# Patient Record
Sex: Female | Born: 1965 | Race: White | Hispanic: No | Marital: Married | Smoking: Former smoker
Health system: Southern US, Community
[De-identification: ages and names within clinical notes are randomized; demographics above are authoritative.]

## PROBLEM LIST (undated history)

## (undated) DIAGNOSIS — K219 Gastro-esophageal reflux disease without esophagitis: Secondary | ICD-10-CM

## (undated) HISTORY — PX: TUBAL LIGATION: SHX77

## (undated) HISTORY — PX: WISDOM TOOTH EXTRACTION: SHX21

## (undated) HISTORY — PX: TONSILLECTOMY: SUR1361

## (undated) HISTORY — PX: EYE SURGERY: SHX253

## (undated) HISTORY — PX: LEEP: SHX91

---

## 1998-02-09 ENCOUNTER — Other Ambulatory Visit: Admission: RE | Admit: 1998-02-09 | Discharge: 1998-02-09 | Payer: Self-pay | Admitting: Obstetrics and Gynecology

## 1998-07-28 ENCOUNTER — Other Ambulatory Visit: Admission: RE | Admit: 1998-07-28 | Discharge: 1998-07-28 | Payer: Self-pay | Admitting: Obstetrics and Gynecology

## 2000-10-02 ENCOUNTER — Emergency Department (HOSPITAL_COMMUNITY): Admission: EM | Admit: 2000-10-02 | Discharge: 2000-10-02 | Payer: Self-pay | Admitting: Emergency Medicine

## 2000-10-02 ENCOUNTER — Encounter: Payer: Self-pay | Admitting: Emergency Medicine

## 2001-10-29 ENCOUNTER — Other Ambulatory Visit: Admission: RE | Admit: 2001-10-29 | Discharge: 2001-10-29 | Payer: Self-pay | Admitting: Obstetrics and Gynecology

## 2002-10-12 ENCOUNTER — Ambulatory Visit (HOSPITAL_COMMUNITY): Admission: RE | Admit: 2002-10-12 | Discharge: 2002-10-12 | Payer: Self-pay | Admitting: Internal Medicine

## 2002-10-28 ENCOUNTER — Other Ambulatory Visit: Admission: RE | Admit: 2002-10-28 | Discharge: 2002-10-28 | Payer: Self-pay | Admitting: Obstetrics and Gynecology

## 2003-04-30 ENCOUNTER — Other Ambulatory Visit: Admission: RE | Admit: 2003-04-30 | Discharge: 2003-04-30 | Payer: Self-pay | Admitting: Obstetrics and Gynecology

## 2003-12-04 ENCOUNTER — Other Ambulatory Visit: Admission: RE | Admit: 2003-12-04 | Discharge: 2003-12-04 | Payer: Self-pay | Admitting: Obstetrics and Gynecology

## 2004-05-11 ENCOUNTER — Inpatient Hospital Stay (HOSPITAL_COMMUNITY): Admission: AD | Admit: 2004-05-11 | Discharge: 2004-05-15 | Payer: Self-pay | Admitting: Obstetrics and Gynecology

## 2004-06-25 ENCOUNTER — Other Ambulatory Visit: Admission: RE | Admit: 2004-06-25 | Discharge: 2004-06-25 | Payer: Self-pay | Admitting: Obstetrics and Gynecology

## 2005-07-29 ENCOUNTER — Other Ambulatory Visit: Admission: RE | Admit: 2005-07-29 | Discharge: 2005-07-29 | Payer: Self-pay | Admitting: Obstetrics and Gynecology

## 2006-09-18 ENCOUNTER — Ambulatory Visit (HOSPITAL_COMMUNITY): Admission: RE | Admit: 2006-09-18 | Discharge: 2006-09-18 | Payer: Self-pay | Admitting: Obstetrics and Gynecology

## 2007-06-17 IMAGING — MG MM DIGITAL SCREENING BILAT
5 series · 5 of 5 positions shown · non-contrast
Comparison: none

DG SCREEN MAMMOGRAM BILATERAL
Bilateral CC and MLO view(s) were taken.

DIGITAL SCREENING MAMMOGRAM WITH CAD:
The breast tissue is heterogeneously dense.  There is no dominant mass, architectural distortion or
calcification to suggest malignancy.

[R CC]
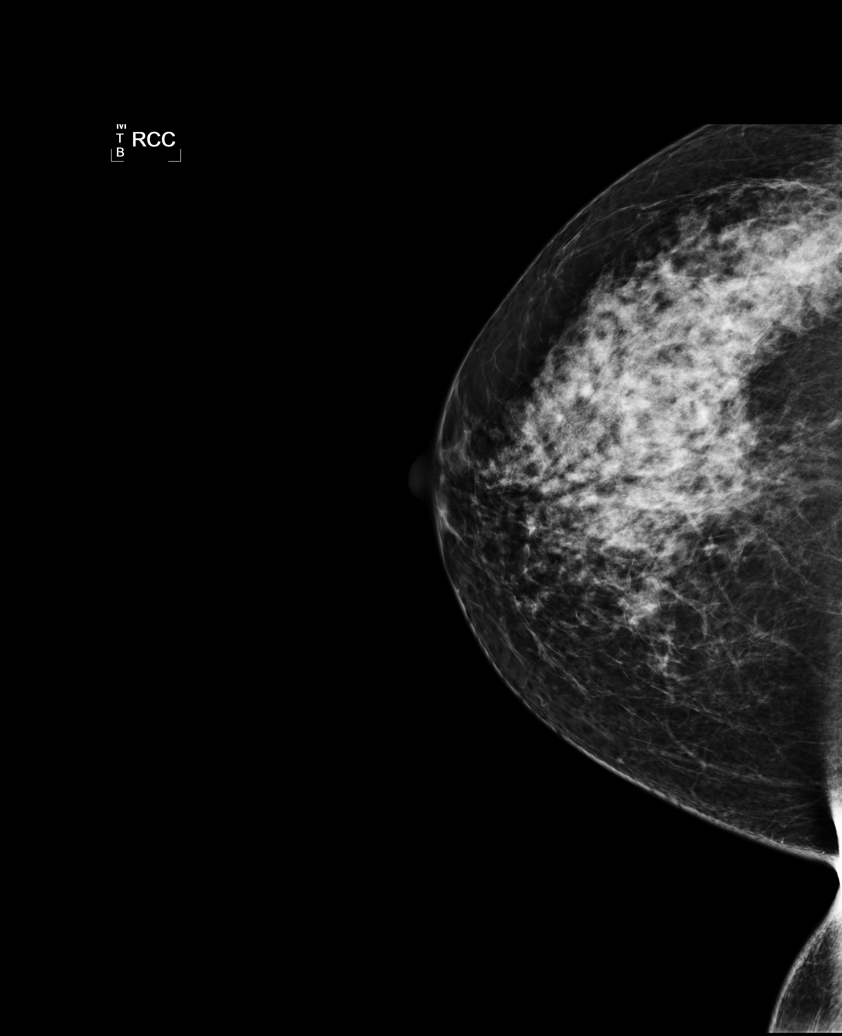

[R MLO]
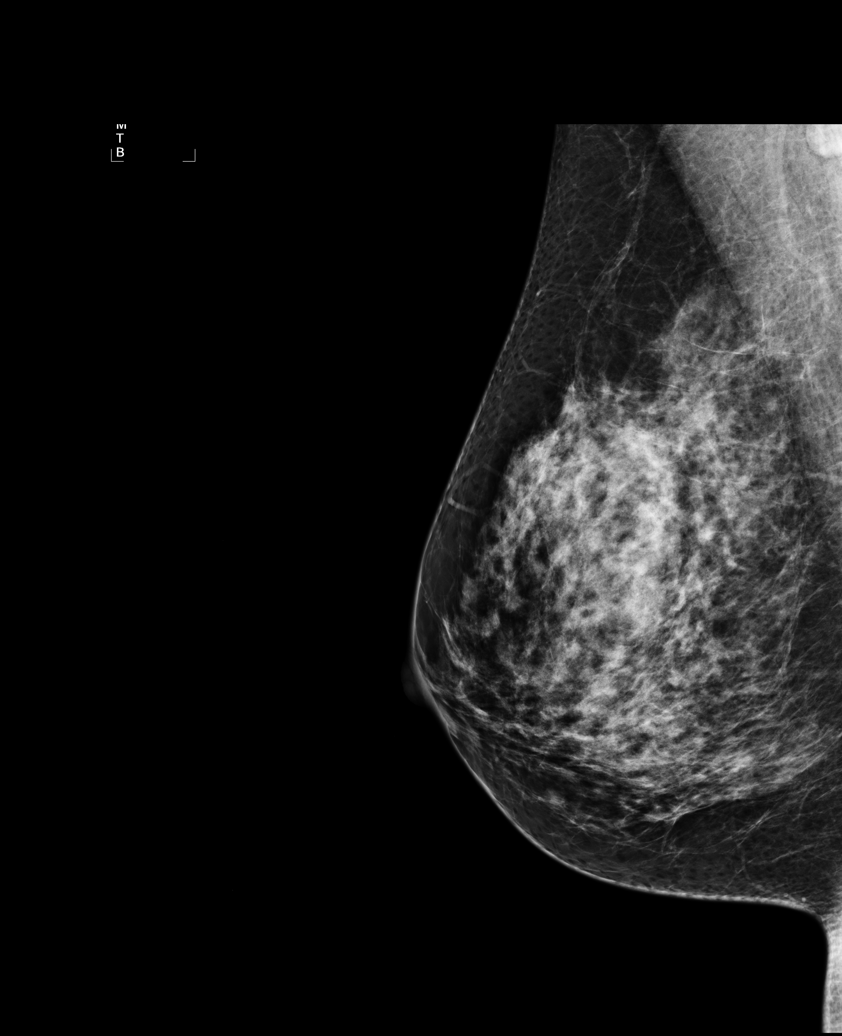

[L CC]
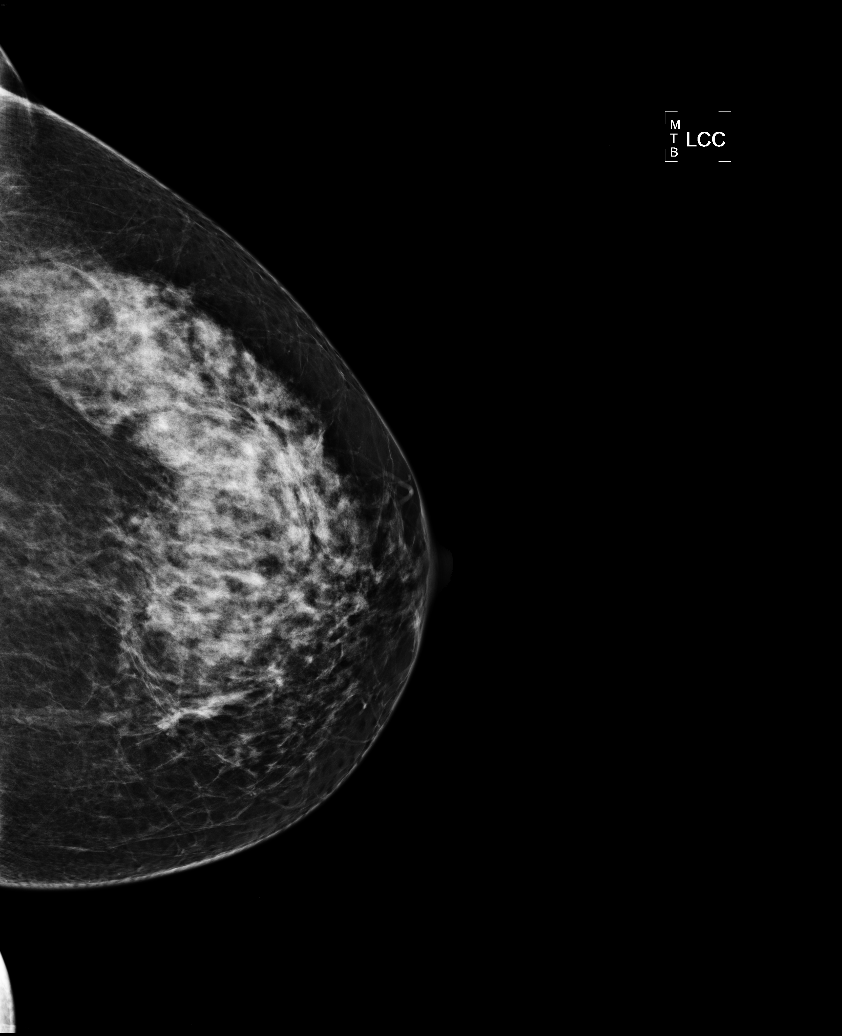

[L MLO]
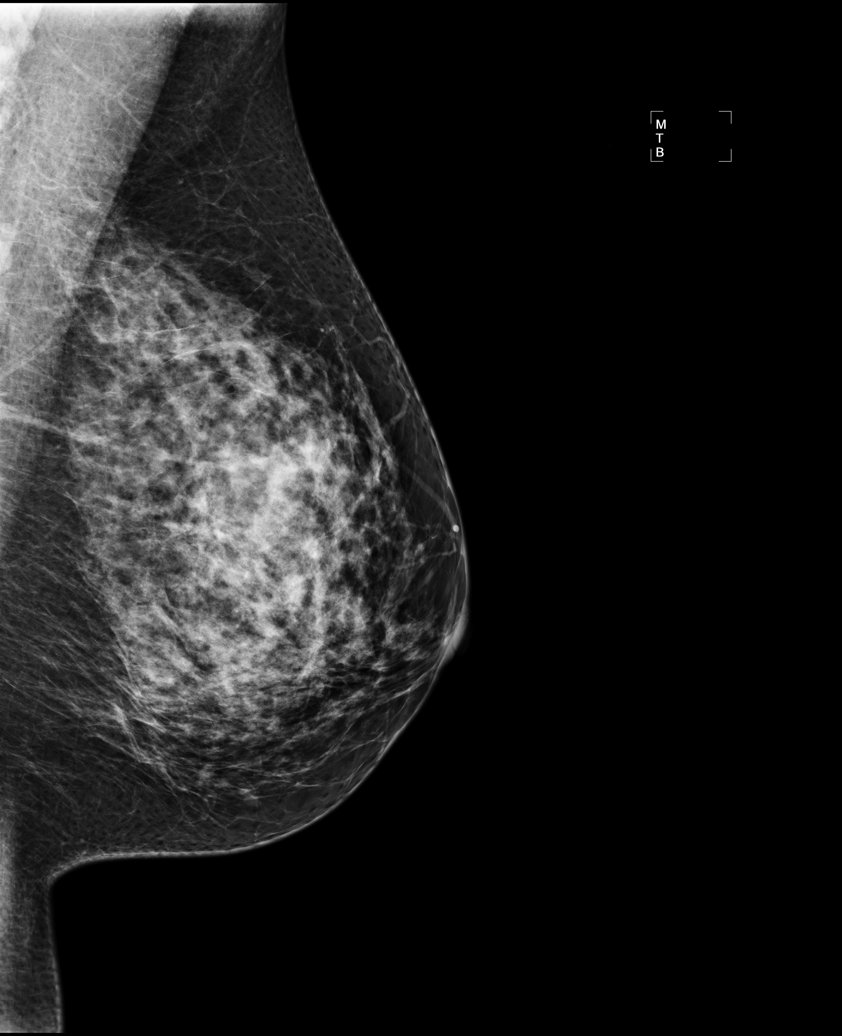

[R XCCL]
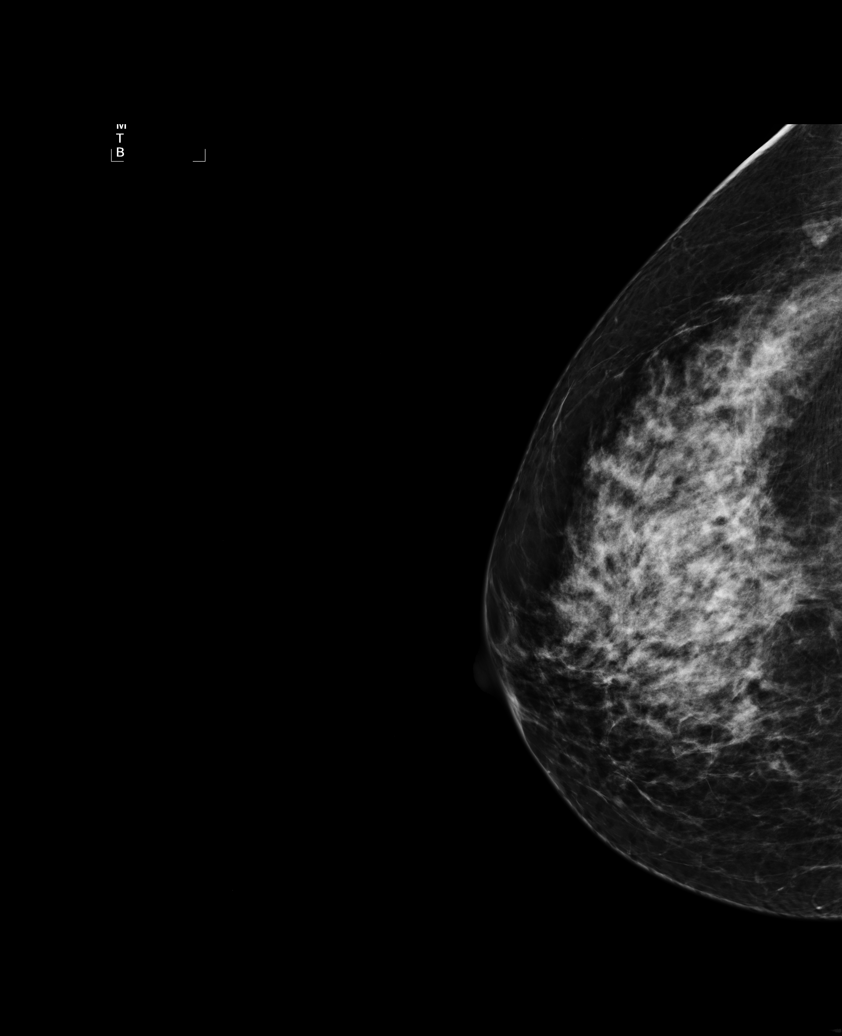

[5 of 5 positions shown; findings below may reference images not displayed]

IMPRESSION: No mammographic evidence of malignancy.  Suggest yearly screening mammography.

ASSESSMENT: Negative - BI-RADS 1

Screening mammogram in 1 year.
ANALYZED BY COMPUTER AIDED DETECTION. , THIS PROCEDURE WAS A DIGITAL MAMMOGRAM.

## 2007-10-03 ENCOUNTER — Encounter: Admission: RE | Admit: 2007-10-03 | Discharge: 2007-10-03 | Payer: Self-pay | Admitting: Obstetrics and Gynecology

## 2008-03-14 ENCOUNTER — Emergency Department (HOSPITAL_COMMUNITY): Admission: EM | Admit: 2008-03-14 | Discharge: 2008-03-14 | Payer: Self-pay | Admitting: Family Medicine

## 2008-10-29 ENCOUNTER — Encounter: Admission: RE | Admit: 2008-10-29 | Discharge: 2008-10-29 | Payer: Self-pay | Admitting: Obstetrics and Gynecology

## 2009-12-17 ENCOUNTER — Encounter: Admission: RE | Admit: 2009-12-17 | Discharge: 2009-12-17 | Payer: Self-pay | Admitting: Obstetrics and Gynecology

## 2010-12-31 ENCOUNTER — Other Ambulatory Visit: Payer: Self-pay | Admitting: Obstetrics and Gynecology

## 2010-12-31 DIAGNOSIS — Z1231 Encounter for screening mammogram for malignant neoplasm of breast: Secondary | ICD-10-CM

## 2011-01-17 ENCOUNTER — Ambulatory Visit: Payer: Self-pay

## 2011-01-26 ENCOUNTER — Ambulatory Visit
Admission: RE | Admit: 2011-01-26 | Discharge: 2011-01-26 | Disposition: A | Discharge status: Home / Self Care | Payer: BC Managed Care – PPO | Source: Ambulatory Visit | Attending: Obstetrics and Gynecology | Admitting: Obstetrics and Gynecology

## 2011-01-26 DIAGNOSIS — Z1231 Encounter for screening mammogram for malignant neoplasm of breast: Secondary | ICD-10-CM

## 2011-03-25 NOTE — Discharge Summary (Signed)
Caitlin Nielsen, Caitlin Nielsen                         ACCOUNT NO.:  [id number]   MEDICAL RECORD NO.:  [id number]                   PATIENT TYPE:  INP   LOCATION:  9112                                 FACILITY:  [facility]   PHYSICIAN:  Huel Cote, M.D.              DATE OF BIRTH:  11/15/1965   DATE OF ADMISSION:  05/11/2004  DATE OF DISCHARGE:  05/15/2004                                 DISCHARGE SUMMARY   DISCHARGE DIAGNOSES:  1. Preterm pregnancy at 36 plus weeks delivered.  2. Premature rupture of membranes.  3. Failed augmentation of labor.  4. Status post primary low transverse cesarean section.   FOLLOW UP:  The patient is to followup in two weeks for an incision check.   DISCHARGE MEDICATIONS:  1. Motrin 600 mg p.o. every six hours.  2. Percocet 1-2 tablets p.o. every four hours.   HOSPITAL COURSE:  The patient is a 45 year old, G3, P2-0-0-2 who is admitted  at 58 plus weeks with spontaneous rupture of membranes and no active  contractions.  Prenatal care had been complicated by an IVF pregnancy with  donor eggs given a prior tubal ligation.  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  Prenatal labs are as follows:  B  positive, antibody negative, RPR nonreactive, rubella immune, hepatitis B  surface antigen negative, HIV negative, GC and chlamydia negative, triple  screen normal, one hour Glucola 149 and three hour was normal.  Group B  strep was unknown.   PAST OBSTETRICAL HISTORY:  In 1994, she had an 8 pound 11 ounce infant at 41  weeks, normal vaginal delivery.  In 1997, she had a 9 pound 2 ounce infant,  vaginal delivery at 41 weeks.   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   PAST MEDICAL HISTORY:  None.   PAST SURGICAL HISTORY:  Tubal ligation in 1997, tonsillectomy in 1973.   ALLERGIES:  No known drug allergies.   On admission cervix was 50, closed and high and she was placed on Pitocin  and penicillin for her  unknown group B strep status.  The patient continued  on Pitocin throughout the day; however, made absolutely no progress despite  increasing Pitocin to 32 milliunits throughout the day.  The patient  continued in the hospital and Pitocin was discontinued given no real  progress being made and was placed on Cytotec for possible ripening given  that there was no evidence of infection.  The patient really had no  significant changes with Cytotec in three doses and therefore given that she  had now been ruptured for almost 48 hours desired to proceed with cesarean  section.  She underwent a low transverse cesarean section and was delivered  of a vigorous female infant, Apgar's are 9 & 9, weight was 5 pounds 15 ounces  and was then admitted for routine  postoperative care.  She did very well and  on  postoperative day #3 was tolerating a regular diet with pain control and was  afebrile with stable vital signs.  Her incision was clear and well  approximated and a Prolene suture was removed prior to discharge. She was  then instructed to followup in two weeks with Dr. Jackelyn Knife in the office  with medications as previously stated.                                               Huel Cote, M.D.    KR/MEDQ  D:  06/08/2004  T:  06/09/2004  Job:  [id number]

## 2011-03-25 NOTE — Op Note (Signed)
Caitlin Nielsen, Caitlin Nielsen                         ACCOUNT NO.:  [id number]   MEDICAL RECORD NO.:  [id number]                   PATIENT TYPE:  INP   LOCATION:  9112                                 FACILITY:  [facility]   PHYSICIAN:  Zenaida Niece, M.D.             DATE OF BIRTH:  20-Oct-1966   DATE OF PROCEDURE:  05/12/2004  DATE OF DISCHARGE:                                 OPERATIVE REPORT   PREOPERATIVE DIAGNOSES:  Intrauterine pregnancy at 37 weeks, prolonged  premature rupture of membranes and failure to progress.   POSTOPERATIVE DIAGNOSES:  Intrauterine pregnancy at 37 weeks, prolonged  premature rupture of membranes and failure to progress.   PROCEDURE:  Primary low transverse cesarean section.   SURGEON:  Zenaida Niece, M.D.   ANESTHESIA:  Spinal.   ESTIMATED BLOOD LOSS:  800 mL.   FINDINGS:  The patient had normal anatomy with evidence of prior tubal  ligation. She delivered a viable female infant with Apgar of 9 & 9 and weighed  5 pounds 15 ounces.   DESCRIPTION OF PROCEDURE:  The patient was taken to the operating room and  placed in the sitting position.  Dr. Pamalee Leyden instilled spinal anesthesia and  she was placed in the dorsal supine position with left lateral tilt.  The  abdomen was prepped and draped in the usual sterile fashion and a Foley  catheter inserted.  The level of her anesthesia was found to be adequate.  Her abdomen was entered via a standard Pfannenstiel incision.  A 4 cm  transverse incision was made in the lower uterine segment pushing the  bladder inferior.  This lower uterine segment was noted to be fairly thick.  Clear amniotic fluid was noted upon entering the amniotic cavity.  The  incision was extended bilaterally digitally. The fetal vertex was grasped  and delivered through the incision atraumatically.  The mouth and nares were  suctioned and the remainder of the infant delivered atraumatically. The cord  was doubly clamped and cut and the infant  handed to the awaiting pediatric  team. Cord blood and a segment of cord for gas were obtained and the  placenta delivered spontaneously. The uterus is wiped dry with a clean lap  pad and all clots and debris removed and the cervix dilated with the long  ring forceps.  The uterine incision was inspected and found to be free of  extensions.  The incision was closed in one layer being a running locking  layer with #1 chromic with adequate hemostasis.  Bleeding from serosal edges  was controlled with electrocautery.  Tubes and ovaries were inspected and  found to be normal with evidence of prior tubal ligation.  The uterine  incision was again inspected and found to be hemostatic. The subfascial  space was irrigated and made hemostatic with electrocautery.  The fascia was  closed in a running fashion starting at both ends and meeting  in the middle  with #0 Vicryl. The subcutaneous tissue  was irrigated and made hemostatic with electrocautery.  The skin was closed  with a running subcuticular suture of 4-0 Prolene followed by Steri-Strips  and a bandage.  The patient tolerated the procedure well and was taken to  the recovery room in stable condition. Counts were correct x2 and she  received 1 g of Ancef after cord clamped.                                               Zenaida Niece, M.D.    TDM/MEDQ  D:  05/12/2004  T:  05/13/2004  Job:  [id number]

## 2011-12-21 ENCOUNTER — Other Ambulatory Visit: Payer: Self-pay | Admitting: Obstetrics and Gynecology

## 2011-12-21 DIAGNOSIS — Z1231 Encounter for screening mammogram for malignant neoplasm of breast: Secondary | ICD-10-CM

## 2012-01-27 ENCOUNTER — Ambulatory Visit
Admission: RE | Admit: 2012-01-27 | Discharge: 2012-01-27 | Disposition: A | Discharge status: Home / Self Care | Payer: BC Managed Care – PPO | Source: Ambulatory Visit | Attending: Obstetrics and Gynecology | Admitting: Obstetrics and Gynecology

## 2012-01-27 DIAGNOSIS — Z1231 Encounter for screening mammogram for malignant neoplasm of breast: Secondary | ICD-10-CM

## 2013-01-14 ENCOUNTER — Other Ambulatory Visit: Payer: Self-pay

## 2013-01-14 DIAGNOSIS — Z1231 Encounter for screening mammogram for malignant neoplasm of breast: Secondary | ICD-10-CM

## 2013-02-06 ENCOUNTER — Ambulatory Visit
Admission: RE | Admit: 2013-02-06 | Discharge: 2013-02-06 | Disposition: A | Discharge status: Home / Self Care | Payer: BC Managed Care – PPO | Source: Ambulatory Visit

## 2013-02-06 DIAGNOSIS — Z1231 Encounter for screening mammogram for malignant neoplasm of breast: Secondary | ICD-10-CM

## 2013-03-15 ENCOUNTER — Encounter (HOSPITAL_COMMUNITY): Payer: Self-pay

## 2013-03-26 ENCOUNTER — Encounter (HOSPITAL_COMMUNITY): Payer: Self-pay | Admitting: *Deleted

## 2013-03-26 NOTE — H&P (Signed)
Caitlin Nielsen is an 47 y.o. female. 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  Due to this she is being admitted for cone biopsy for further evaluation and treatment.  Pertinent Gynecological History: OB History: G3, P3003 SVD at term x 2, last delivery c-section at term   Menstrual History: Patient's last menstrual period was 12/22/2012.    Past Medical History  Diagnosis Date  . SVD (spontaneous vaginal delivery)     x 2    Past Surgical History  Procedure Laterality Date  . Tubal ligation    . Cesarean section      x 1  . Tonsillectomy    . Wisdom tooth extraction    . Eye surgery      lasik eye surgery  . Leep      History reviewed. No pertinent family history.  Social History:  reports that she has quit smoking. She has never used smokeless tobacco. She reports that  drinks alcohol. She reports that she does not use illicit drugs.  Allergies: No Known Allergies  No prescriptions prior to admission    Review of Systems  Respiratory: Negative.   Cardiovascular: Negative.   Gastrointestinal: Negative.   Genitourinary: Negative.     Height 5\' 10"  (1.778 m), weight 74.844 kg (165 lb), last menstrual period 12/22/2012. Physical Exam  Constitutional: She appears well-developed and well-nourished.  Neck: Neck supple. No thyromegaly present.  Cardiovascular: Normal rate, regular rhythm and normal heart sounds.   No murmur heard. Respiratory: Effort normal and breath sounds normal. No respiratory distress. She has no wheezes.  GI: Soft. She exhibits no distension and no mass. There is no tenderness.  Genitourinary: Vagina normal and uterus normal.  No adnexal mass    No results found for this or any previous visit (from the past 24 hour(s)).  No results found.  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  Discussed findings and procedure and risks for cone biopsy.  Will admit for cold knife cone biopsy.    Caitlin Nielsen D 03/26/2013, 4:47 PM

## 2013-03-27 ENCOUNTER — Encounter (HOSPITAL_COMMUNITY): Payer: Self-pay | Admitting: Certified Registered"

## 2013-03-27 ENCOUNTER — Ambulatory Visit (HOSPITAL_COMMUNITY)
Admission: RE | Admit: 2013-03-27 | Discharge: 2013-03-27 | Disposition: A | Discharge status: Home / Self Care | Payer: BC Managed Care – PPO | Source: Ambulatory Visit | Attending: Obstetrics and Gynecology | Admitting: Obstetrics and Gynecology

## 2013-03-27 ENCOUNTER — Encounter (HOSPITAL_COMMUNITY)
Admission: RE | Disposition: A | Discharge status: Home / Self Care | Payer: Self-pay | Source: Ambulatory Visit | Attending: Obstetrics and Gynecology

## 2013-03-27 ENCOUNTER — Ambulatory Visit (HOSPITAL_COMMUNITY): Payer: BC Managed Care – PPO | Admitting: Certified Registered"

## 2013-03-27 DIAGNOSIS — D069 Carcinoma in situ of cervix, unspecified: Secondary | ICD-10-CM | POA: Insufficient documentation

## 2013-03-27 HISTORY — PX: CERVICAL CONIZATION W/BX: SHX1330

## 2013-03-27 LAB — CBC
MCV: 95 fL (ref 78.0–100.0)
Platelets: 200 10*3/uL (ref 150–400)
RDW: 12.9 % (ref 11.5–15.5)
WBC: 7 10*3/uL (ref 4.0–10.5)

## 2013-03-27 SURGERY — CONE BIOPSY, CERVIX
Anesthesia: General | Site: Vagina | Wound class: Clean Contaminated

## 2013-03-27 MED ORDER — LACTATED RINGERS IV SOLN
INTRAVENOUS | Status: DC
  Administered 2013-03-27: 08:00:00 via INTRAVENOUS

## 2013-03-27 MED ORDER — KETOROLAC TROMETHAMINE 30 MG/ML IJ SOLN
INTRAMUSCULAR | Status: AC
  Filled 2013-03-27: qty 1

## 2013-03-27 MED ORDER — LACTATED RINGERS IV SOLN
INTRAVENOUS | Status: DC
  Administered 2013-03-27: 10:00:00 via INTRAVENOUS

## 2013-03-27 MED ORDER — MIDAZOLAM HCL 5 MG/5ML IJ SOLN
INTRAMUSCULAR | Status: DC | PRN
  Administered 2013-03-27: 2 mg via INTRAVENOUS

## 2013-03-27 MED ORDER — LIDOCAINE HCL 1 % IJ SOLN
INTRAMUSCULAR | Status: DC | PRN
  Administered 2013-03-27: 16 mL

## 2013-03-27 MED ORDER — HYDROCODONE-ACETAMINOPHEN 5-325 MG PO TABS: 1.0000 | ORAL_TABLET | Freq: Four times a day (QID) | ORAL | Status: DC | PRN

## 2013-03-27 MED ORDER — PROPOFOL 10 MG/ML IV EMUL
INTRAVENOUS | Status: AC
  Filled 2013-03-27: qty 20

## 2013-03-27 MED ORDER — DEXAMETHASONE SODIUM PHOSPHATE 10 MG/ML IJ SOLN
INTRAMUSCULAR | Status: AC
  Filled 2013-03-27: qty 1

## 2013-03-27 MED ORDER — KETOROLAC TROMETHAMINE 30 MG/ML IJ SOLN
INTRAMUSCULAR | Status: DC | PRN
  Administered 2013-03-27: 30 mg via INTRAVENOUS

## 2013-03-27 MED ORDER — IODINE STRONG (LUGOLS) 5 % PO SOLN
ORAL | Status: DC | PRN
  Administered 2013-03-27: 0.2 mL

## 2013-03-27 MED ORDER — FENTANYL CITRATE 0.05 MG/ML IJ SOLN
INTRAMUSCULAR | Status: AC
  Filled 2013-03-27: qty 2

## 2013-03-27 MED ORDER — FENTANYL CITRATE 0.05 MG/ML IJ SOLN
INTRAMUSCULAR | Status: DC | PRN
  Administered 2013-03-27 (×2): 50 ug via INTRAVENOUS

## 2013-03-27 MED ORDER — FENTANYL CITRATE 0.05 MG/ML IJ SOLN: 25.0000 ug | INTRAMUSCULAR | Status: DC | PRN

## 2013-03-27 MED ORDER — ACETIC ACID 4% SOLUTION
Status: DC | PRN
  Administered 2013-03-27: 1 via TOPICAL

## 2013-03-27 MED ORDER — HYDROCODONE-ACETAMINOPHEN 5-325 MG PO TABS
1.0000 | ORAL_TABLET | Freq: Once | ORAL | Status: AC
  Administered 2013-03-27: 1 via ORAL

## 2013-03-27 MED ORDER — PROPOFOL 10 MG/ML IV BOLUS
INTRAVENOUS | Status: DC | PRN
  Administered 2013-03-27: 170 ug via INTRAVENOUS

## 2013-03-27 MED ORDER — ONDANSETRON HCL 4 MG/2ML IJ SOLN
INTRAMUSCULAR | Status: AC
  Filled 2013-03-27: qty 2

## 2013-03-27 MED ORDER — MIDAZOLAM HCL 2 MG/2ML IJ SOLN
INTRAMUSCULAR | Status: AC
  Filled 2013-03-27: qty 2

## 2013-03-27 MED ORDER — LIDOCAINE HCL (CARDIAC) 20 MG/ML IV SOLN
INTRAVENOUS | Status: AC
  Filled 2013-03-27: qty 5

## 2013-03-27 MED ORDER — HYDROCODONE-ACETAMINOPHEN 5-325 MG PO TABS
ORAL_TABLET | ORAL | Status: AC
  Filled 2013-03-27: qty 1

## 2013-03-27 MED ORDER — LIDOCAINE HCL (CARDIAC) 20 MG/ML IV SOLN
INTRAVENOUS | Status: DC | PRN
  Administered 2013-03-27: 30 mg via INTRAVENOUS
  Administered 2013-03-27: 20 mg via INTRAVENOUS

## 2013-03-27 MED ORDER — ONDANSETRON HCL 4 MG/2ML IJ SOLN
INTRAMUSCULAR | Status: DC | PRN
  Administered 2013-03-27: 4 mg via INTRAVENOUS

## 2013-03-27 SURGICAL SUPPLY — 38 items
APPLICATOR COTTON TIP 6IN STRL (MISCELLANEOUS) ×2 IMPLANT
BLADE SURG 15 STRL LF C SS BP (BLADE) ×1 IMPLANT
BLADE SURG 15 STRL SS (BLADE) ×2
CATH ROBINSON RED A/P 16FR (CATHETERS) ×2 IMPLANT
CLOTH BEACON ORANGE TIMEOUT ST (SAFETY) ×2 IMPLANT
CONTAINER PREFILL 10% NBF 60ML (FORM) ×2 IMPLANT
COUNTER NEEDLE 1200 MAGNETIC (NEEDLE) ×2 IMPLANT
DECANTER SPIKE VIAL GLASS SM (MISCELLANEOUS) ×2 IMPLANT
DRESSING TELFA 8X3 (GAUZE/BANDAGES/DRESSINGS) ×2 IMPLANT
ELECT BALL LEEP 5MM RED (ELECTRODE) ×2 IMPLANT
ELECT REM PT RETURN 9FT ADLT (ELECTROSURGICAL) ×2
ELECTRODE REM PT RTRN 9FT ADLT (ELECTROSURGICAL) ×1 IMPLANT
GAUZE SPONGE 4X4 16PLY XRAY LF (GAUZE/BANDAGES/DRESSINGS) IMPLANT
GLOVE BIO SURGEON STRL SZ8 (GLOVE) ×2 IMPLANT
GLOVE ORTHO TXT STRL SZ7.5 (GLOVE) ×2 IMPLANT
GOWN STRL REIN XL XLG (GOWN DISPOSABLE) ×4 IMPLANT
HEMOSTAT SURGICEL 2X14 (HEMOSTASIS) IMPLANT
HEMOSTAT SURGICEL 2X3 (HEMOSTASIS) ×1 IMPLANT
HOSE NS SMOKE EVAC 7/8 X6 (MISCELLANEOUS) ×1 IMPLANT
NDL SPNL 22GX3.5 QUINCKE BK (NEEDLE) ×1 IMPLANT
NEEDLE SPNL 22GX3.5 QUINCKE BK (NEEDLE) ×2 IMPLANT
NS IRRIG 1000ML POUR BTL (IV SOLUTION) ×1 IMPLANT
PACK VAGINAL MINOR WOMEN LF (CUSTOM PROCEDURE TRAY) ×2 IMPLANT
PAD OB MATERNITY 4.3X12.25 (PERSONAL CARE ITEMS) ×2 IMPLANT
PENCIL BUTTON HOLSTER BLD 10FT (ELECTRODE) ×2 IMPLANT
REDUCER FITTING SMOKE EVAC (MISCELLANEOUS) ×1 IMPLANT
SCOPETTES 8  STERILE (MISCELLANEOUS) ×1
SCOPETTES 8 STERILE (MISCELLANEOUS) ×2 IMPLANT
SPONGE SURGIFOAM ABS GEL 12-7 (HEMOSTASIS) IMPLANT
SUT CHROMIC 1 CT1 27 (SUTURE) ×4 IMPLANT
SYR 20CC LL (SYRINGE) ×2 IMPLANT
SYR CONTROL 10ML LL (SYRINGE) ×2 IMPLANT
SYR TB 1ML 27GX1/2 SAFE (SYRINGE) ×1 IMPLANT
SYR TB 1ML 27GX1/2 SAFETY (SYRINGE) ×2
TOWEL OR 17X24 6PK STRL BLUE (TOWEL DISPOSABLE) ×4 IMPLANT
TUBING NON-CON 1/4 X 20 CONN (TUBING) ×2 IMPLANT
WATER STERILE IRR 1000ML POUR (IV SOLUTION) ×2 IMPLANT
YANKAUER SUCT BULB TIP NO VENT (SUCTIONS) ×2 IMPLANT

## 2013-03-27 NOTE — Transfer of Care (Signed)
Immediate Anesthesia Transfer of Care Note  Patient: Caitlin Nielsen  Procedure(s) Performed: Procedure(s): CONIZATION CERVIX WITH BIOPSY, cold knife (N/A)  Patient Location: PACU  Anesthesia Type:General  Level of Consciousness: awake  Airway & Oxygen Therapy: Patient Spontanous Breathing and Patient connected to nasal cannula oxygen  Post-op Assessment: Report given to PACU RN and Post -op Vital signs reviewed and stable  Post vital signs: Reviewed and stable  Complications: No apparent anesthesia complications

## 2013-03-27 NOTE — OR Nursing (Signed)
Light headed. Resumed IV fluids. Reclined in chair.

## 2013-03-27 NOTE — Interval H&P Note (Signed)
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 After consideration of risks, benefits and other options for treatment, the patient has consented to  Procedure(s): CONIZATION CERVIX WITH BIOPSY, cold knife (N/A) as a surgical intervention .  The patient's history has been reviewed, patient examined, no change in status, stable for surgery.  I have reviewed the patient's chart and labs.  Questions were answered to the patient's satisfaction.     [name] D

## 2013-03-27 NOTE — Anesthesia Preprocedure Evaluation (Addendum)
Anesthesia Evaluation  Patient identified by MRN, date of birth, ID band Patient awake    Reviewed: Allergy & Precautions, H&P , Patient's Chart, lab work & pertinent test results, reviewed documented beta blocker date and time   Airway Mallampati: II TM Distance: >3 FB Neck ROM: full    Dental no notable dental hx.    Pulmonary  breath sounds clear to auscultation  Pulmonary exam normal       Cardiovascular Rhythm:regular Rate:Normal     Neuro/Psych    GI/Hepatic   Endo/Other    Renal/GU      Musculoskeletal   Abdominal   Peds  Hematology   Anesthesia Other Findings   Reproductive/Obstetrics                           Anesthesia Physical Anesthesia Plan  ASA: II  Anesthesia Plan: General   Post-op Pain Management:    Induction: Intravenous  Airway Management Planned: LMA and Mask  Additional Equipment:   Intra-op Plan:   Post-operative Plan:   Informed Consent: I have reviewed the patients History and Physical, chart, labs and discussed the procedure including the risks, benefits and alternatives for the proposed anesthesia with the patient or authorized representative who has indicated his/her understanding and acceptance.   Dental Advisory Given  Plan Discussed with: CRNA and Surgeon  Anesthesia Plan Comments: (  Discussed  general anesthesia, including possible nausea, instrumentation of airway, sore throat,pulmonary aspiration, etc. I asked if the were any outstanding questions, or  concerns before we proceeded. )       Anesthesia Quick Evaluation

## 2013-03-27 NOTE — Op Note (Signed)
Preoperative diagnosis: CIN-3 Postoperative diagnosis: Same Procedure: Cold knife cone biopsy Surgeon: Lavina Hamman M.D. Anesthesia: Monitored anesthesia care Estimated blood loss: 50 cc Findings: She had a well-healed LEEP bed, the entire ectocervix stained with Lugol's solution Complications: None Specimens: Cone biopsy   Procedure in detail:  The patient was taken to the operating room placed in the dorsosupine position. She was given IV sedation and placed in mobile stirrups. Perineum and vagina were then prepped and draped in the usual sterile fashion and the legs were elevated in stirrups. Bladder was drained with a red Robinson catheter. A weighted speculum was inserted into the vagina and Deaver retractors used anteriorly and laterally. Stay sutures of #0 chromic were placed at 3 and 9:00. Deep paracervical block was then performed with a total of 16 cc of 1% plain lidocaine. The cervix was then stained with Lugol's solution and there were no nonstaining areas. The previous lead bed was well demarcated. I then used a scalpel to remove a cone biopsy specimen out well lateral of any ectocervical margins and took a deep endocervical margin. The specimen was removed intact. Bleeding was controlled with electrocautery. The cone bed was then packed with Surgicel and the stay sutures were tied across the intercourse the cervix. No significant bleeding was noted. All instruments were removed from the vagina. The patient was taken down from stirrups and taken to recovery in stable condition. Counts were correct and she had PAS hose on throughout the procedure.

## 2013-03-27 NOTE — Anesthesia Postprocedure Evaluation (Signed)
  Anesthesia Post-op Note  Patient: Caitlin Nielsen  Procedure(s) Performed: Procedure(s): CONIZATION CERVIX WITH BIOPSY, cold knife (N/A)  Patient is awake and responsive. Pain and nausea are reasonably well controlled. Vital signs are stable and clinically acceptable. Oxygen saturation is clinically acceptable. There are no apparent anesthetic complications at this time. Patient is ready for discharge.

## 2013-03-28 ENCOUNTER — Encounter (HOSPITAL_COMMUNITY): Payer: Self-pay | Admitting: Obstetrics and Gynecology

## 2019-08-27 ENCOUNTER — Other Ambulatory Visit: Payer: Self-pay

## 2019-08-27 ENCOUNTER — Emergency Department: Payer: BC Managed Care – PPO

## 2019-08-27 ENCOUNTER — Encounter: Payer: Self-pay | Admitting: Emergency Medicine

## 2019-08-27 ENCOUNTER — Emergency Department
Admission: EM | Admit: 2019-08-27 | Discharge: 2019-08-27 | Disposition: A | Discharge status: Home / Self Care | Payer: BC Managed Care – PPO | Attending: Emergency Medicine | Admitting: Emergency Medicine

## 2019-08-27 DIAGNOSIS — R0602 Shortness of breath: Secondary | ICD-10-CM | POA: Diagnosis present

## 2019-08-27 DIAGNOSIS — R0600 Dyspnea, unspecified: Secondary | ICD-10-CM | POA: Insufficient documentation

## 2019-08-27 DIAGNOSIS — Z20828 Contact with and (suspected) exposure to other viral communicable diseases: Secondary | ICD-10-CM | POA: Insufficient documentation

## 2019-08-27 DIAGNOSIS — Z87891 Personal history of nicotine dependence: Secondary | ICD-10-CM | POA: Diagnosis not present

## 2019-08-27 DIAGNOSIS — R05 Cough: Secondary | ICD-10-CM | POA: Diagnosis not present

## 2019-08-27 DIAGNOSIS — R1013 Epigastric pain: Secondary | ICD-10-CM | POA: Diagnosis not present

## 2019-08-27 DIAGNOSIS — R059 Cough, unspecified: Secondary | ICD-10-CM

## 2019-08-27 HISTORY — DX: Gastro-esophageal reflux disease without esophagitis: K21.9

## 2019-08-27 LAB — CBC WITH DIFFERENTIAL/PLATELET
Abs Immature Granulocytes: 0.02 10*3/uL (ref 0.00–0.07)
Basophils Absolute: 0.1 10*3/uL (ref 0.0–0.1)
Basophils Relative: 1 %
Eosinophils Absolute: 0.1 10*3/uL (ref 0.0–0.5)
Eosinophils Relative: 2 %
HCT: 41.8 % (ref 36.0–46.0)
Hemoglobin: 13.8 g/dL (ref 12.0–15.0)
Immature Granulocytes: 0 %
Lymphocytes Relative: 38 %
Lymphs Abs: 2.8 10*3/uL (ref 0.7–4.0)
MCH: 31.4 pg (ref 26.0–34.0)
MCHC: 33 g/dL (ref 30.0–36.0)
MCV: 95 fL (ref 80.0–100.0)
Monocytes Absolute: 0.5 10*3/uL (ref 0.1–1.0)
Monocytes Relative: 6 %
Neutro Abs: 4 10*3/uL (ref 1.7–7.7)
Neutrophils Relative %: 53 %
Platelets: 289 10*3/uL (ref 150–400)
RBC: 4.4 MIL/uL (ref 3.87–5.11)
RDW: 12.1 % (ref 11.5–15.5)
WBC: 7.5 10*3/uL (ref 4.0–10.5)
nRBC: 0 % (ref 0.0–0.2)

## 2019-08-27 LAB — COMPREHENSIVE METABOLIC PANEL
ALT: 22 U/L (ref 0–44)
AST: 22 U/L (ref 15–41)
Albumin: 4.7 g/dL (ref 3.5–5.0)
Alkaline Phosphatase: 99 U/L (ref 38–126)
Anion gap: 13 (ref 5–15)
BUN: 18 mg/dL (ref 6–20)
CO2: 26 mmol/L (ref 22–32)
Calcium: 9.3 mg/dL (ref 8.9–10.3)
Chloride: 104 mmol/L (ref 98–111)
Creatinine, Ser: 0.69 mg/dL (ref 0.44–1.00)
GFR calc Af Amer: >60 mL/min (ref >60)
GFR calc non Af Amer: >60 mL/min (ref >60)
Glucose, Bld: 123 mg/dL — ABNORMAL HIGH (ref 70–99)
Potassium: 3.7 mmol/L (ref 3.5–5.1)
Sodium: 143 mmol/L (ref 135–145)
Total Bilirubin: 0.6 mg/dL (ref 0.3–1.2)
Total Protein: 7.8 g/dL (ref 6.5–8.1)

## 2019-08-27 LAB — TROPONIN I (HIGH SENSITIVITY): Troponin I (High Sensitivity): 6 ng/L (ref <18)

## 2019-08-27 LAB — SARS CORONAVIRUS 2 (TAT 6-24 HRS): SARS Coronavirus 2: NEGATIVE

## 2019-08-27 MED ORDER — OMEPRAZOLE MAGNESIUM 20 MG PO TBEC: 20.0000 mg | DELAYED_RELEASE_TABLET | Freq: Every day | ORAL | 1 refills | Status: DC

## 2019-08-27 MED ORDER — HYDROCODONE-HOMATROPINE 5-1.5 MG/5ML PO SYRP: 5.0000 mL | ORAL_SOLUTION | Freq: Four times a day (QID) | ORAL | 0 refills | Status: DC | PRN

## 2019-08-27 NOTE — Discharge Instructions (Signed)
As we discussed, your work-up was reassuring tonight.  Most likely your coughing and shortness of breath are due to acid reflux.  However, as per our discussion, we sent a COVID-19 swab.  Please look through this information to learn how to set up a MyChart account if you do not already have one so that you can check your COVID-19 results within the next 24 hours or so.  In the meantime please continue to try to isolate from other people.    Assuming, however, that this is the result of the acid reflux, I recommend that you try taking an over-the-counter medication such as Prilosec (omeprazole).  Try it for a couple of weeks to see if it helps with your symptoms.  Follow-up with your regular doctor to discuss this and your other symptoms.  Return to the emergency department if you develop new or worsening symptoms that concern you.

## 2019-08-27 NOTE — ED Provider Notes (Signed)
[facility] Emergency Department Provider Note  ____________________________________________   First MD Initiated Contact with Patient 08/27/19 [phone]     (approximate)  I have reviewed the triage vital signs and the nursing notes.   HISTORY  Chief Complaint Shortness of Breath    HPI Caitlin Nielsen is a 53 y.o. female who reports no contributory past medical history other than acid reflux and presents for evaluation of acute onset shortness of breath and nonproductive cough.  She said that she had a normal day yesterday and she woke up during the night  with a cough and some difficulty breathing.  She suffers from severe reflux for which she only takes Tums so she thought it might be an issue of the reflux but she does not have a bad taste in her mouth which is frequently the case with reflux.  She has not been in contact with anyone with COVID-19.  She denies fever/chills, chest pain, nausea, vomiting, and dysuria.  She describes the symptoms as moderate and nothing in particular makes it better or worse.  She is having a little bit of burning epigastric discomfort similar to prior episodes of reflux.  She has no history of blood clots in the legs of the lungs, no unilateral leg pain or swelling, no recent immobilizations or trips or surgeries, no birth control or exogenous estrogen, and she does not smoke.  No history of heart disease.        Past Medical History:  Diagnosis Date  . Acid reflux   . SVD (spontaneous vaginal delivery)    x 2    Patient Active Problem List   Diagnosis Date Noted  . CIN III (cervical intraepithelial neoplasia grade III) with severe dysplasia 03/27/2013    Past Surgical History:  Procedure Laterality Date  . CERVICAL CONIZATION W/BX N/A 03/27/2013   Procedure: CONIZATION CERVIX WITH BIOPSY, cold knife;  Surgeon: Cheri Fowler, MD;  Location: [facility];  Service: Gynecology;  Laterality: N/A;  . CESAREAN SECTION     x 1   . EYE SURGERY     lasik eye surgery  . LEEP    . TONSILLECTOMY    . TUBAL LIGATION    . WISDOM TOOTH EXTRACTION      Prior to Admission medications   Medication Sig Start Date End Date Taking? Authorizing Provider  aspirin 81 MG tablet Take 81 mg by mouth daily as needed.    [provider]  HYDROcodone-acetaminophen (NORCO) 5-325 MG per tablet Take 1 tablet by mouth every 6 (six) hours as needed for pain. 03/27/13   Meisinger, Sherren Mocha, MD  HYDROcodone-homatropine [facility]) 5-1.5 MG/5ML syrup Take 5 mLs by mouth every 6 (six) hours as needed for cough. 08/27/19   Hinda Kehr, MD  omeprazole (PRILOSEC OTC) 20 MG tablet Take 1 tablet (20 mg total) by mouth daily. 08/27/19 08/26/20  Hinda Kehr, MD    Allergies Patient has no known allergies.  History reviewed. No pertinent family history.  Social History Social History   Tobacco Use  . Smoking status: Former Smoker    Packs/day: 0.25  . Smokeless tobacco: Never Used  Substance Use Topics  . Alcohol use: Yes    Comment: socially  . Drug use: No    Review of Systems Constitutional: No fever/chills Eyes: No visual changes. ENT: No sore throat. Cardiovascular: Denies chest pain. Respiratory: Acute onset shortness of breath and cough. Gastrointestinal: Some burning epigastric abdominal pain.  No nausea, no vomiting.  No  diarrhea.  No constipation. Genitourinary: Negative for dysuria. Musculoskeletal: Negative for neck pain.  Negative for back pain. Integumentary: Negative for rash. Neurological: Negative for headaches, focal weakness or numbness.   ____________________________________________   PHYSICAL EXAM:  VITAL SIGNS: ED Triage Vitals  Enc Vitals Group     BP 08/27/19 0150 (!) 160/94     Pulse Rate 08/27/19 0150 79     Resp 08/27/19 0150 18     Temp 08/27/19 0150 98.1 F (36.7 C)     Temp Source 08/27/19 0150 Oral     SpO2 08/27/19 0150 97 %     Weight 08/27/19 0148 81.6 kg (180 lb)     Height  08/27/19 0148 1.778 m (5\' 10" )     Head Circumference --      Peak Flow --      Pain Score 08/27/19 0147 0     Pain Loc --      Pain Edu? --      Excl. in GC? --     Constitutional: Alert and oriented.  Appears a little bit uncomfortable but generally well-appearing. Eyes: Conjunctivae are normal.  Head: Atraumatic. Nose: No congestion/rhinnorhea. Mouth/Throat: Mucous membranes are moist. Neck: No stridor.  No meningeal signs.   Cardiovascular: Normal rate, regular rhythm. Good peripheral circulation. Grossly normal heart sounds. Respiratory: Normal respiratory effort.  No retractions.  Frequent cough. Gastrointestinal: Soft and nontender. No distention.  Musculoskeletal: No lower extremity tenderness nor edema. No gross deformities of extremities. Neurologic:  Normal speech and language. No gross focal neurologic deficits are appreciated.  Skin:  Skin is warm, dry and intact.    ____________________________________________   LABS (all labs ordered are listed, but only abnormal results are displayed)  Labs Reviewed  COMPREHENSIVE METABOLIC PANEL - Abnormal; Notable for the following components:      Result Value   Glucose, Bld 123 (*)    All other components within normal limits  SARS CORONAVIRUS 2 (TAT 6-24 HRS)  CBC WITH DIFFERENTIAL/PLATELET  TROPONIN I (HIGH SENSITIVITY)   ____________________________________________  EKG  ED ECG REPORT I, 08/29/19, the attending physician, personally viewed and interpreted this ECG.  Date: 08/27/2019 EKG Time: 1:42 AM Rate: 85 Rhythm: normal sinus rhythm QRS Axis: normal Intervals: normal ST/T Wave abnormalities: normal Narrative Interpretation: no evidence of acute ischemia  ____________________________________________  RADIOLOGY I, 08/29/2019, personally viewed and evaluated these images (plain radiographs) as part of my medical decision making, as well as reviewing the written report by the radiologist.  ED MD  interpretation: No acute abnormalities on chest x-ray  Official radiology report(s): Dg Chest 2 View  Result Date: 08/27/2019 CLINICAL DATA:  Chest pain EXAM: CHEST - 2 VIEW COMPARISON:  None. FINDINGS: The heart size and mediastinal contours are within normal limits. Both lungs are clear. The visualized skeletal structures are unremarkable. IMPRESSION: No active cardiopulmonary disease. Electronically Signed   By: 08/29/2019 M.D.   On: 08/27/2019 02:19    ____________________________________________   PROCEDURES   Procedure(s) performed (including Critical Care):  Procedures   ____________________________________________   INITIAL IMPRESSION / MDM / ASSESSMENT AND PLAN / ED COURSE  As part of my medical decision making, I reviewed the following data within the electronic MEDICAL RECORD NUMBER Nursing notes reviewed and incorporated, Labs reviewed , EKG interpreted , Old chart reviewed, Radiograph reviewed  and Notes from prior ED visits and reviewed 08/29/2019 controlled substance database.     Differential diagnosis includes, but is not limited to, acid  reflux, chemical pneumonitis, community-acquired pneumonia, viral bronchitis, COVID-19.  The patient is well-appearing and in no distress except for frequent cough and clearing her throat.  Normal CBC, normal troponin, normal comprehensive metabolic panel.  Very low risk for PE with Wells score of zero, low risk ACS based on HEAR score.  Vital signs are normal as well other than some mild hypertension.  I suspect this is the result of acid reflux but the patient agreed to a COVID-19 swab which we will obtain prior to her discharge.  She knows to check MyChart for the results.  I gave my usual and customary return precautions.          ____________________________________________  FINAL CLINICAL IMPRESSION(S) / ED DIAGNOSES  Final diagnoses:  Dyspnea, unspecified type  Cough     MEDICATIONS GIVEN DURING THIS VISIT:   Medications - No data to display   ED Discharge Orders         Ordered    HYDROcodone-homatropine (HYCODAN) 5-1.5 MG/5ML syrup  Every 6 hours PRN     08/27/19 0532    omeprazole (PRILOSEC OTC) 20 MG tablet  Daily     08/27/19 0532          *Please note:  Jamas LavKimberly P Ellzey was evaluated in Emergency Department on 08/27/2019 for the symptoms described in the history of present illness. She was evaluated in the context of the global COVID-19 pandemic, which necessitated consideration that the patient might be at risk for infection with the SARS-CoV-2 virus that causes COVID-19. Institutional protocols and algorithms that pertain to the evaluation of patients at risk for COVID-19 are in a state of rapid change based on information released by regulatory bodies including the CDC and federal and state organizations. These policies and algorithms were followed during the patient's care in the ED.  Some ED evaluations and interventions may be delayed as a result of limited staffing during the pandemic.*  Note:  This document was prepared using Dragon voice recognition software and may include unintentional dictation errors.   Loleta RoseForbach, Avangelina Flight, MD 08/27/19 [phone]

## 2019-08-27 NOTE — ED Triage Notes (Addendum)
Pt to triage via w/c with no distress noted, mask in place, brought in by EMS; pt reports awoke with [facility] & nonprod cough; st hx reflux; denies any pain, denies any recent illness

## 2020-05-25 IMAGING — CR DG CHEST 2V
2 series · 2 of 2 positions shown · non-contrast
Comparison: None.

CLINICAL DATA: Chest pain

EXAM:
CHEST - 2 VIEW

[chest pa]
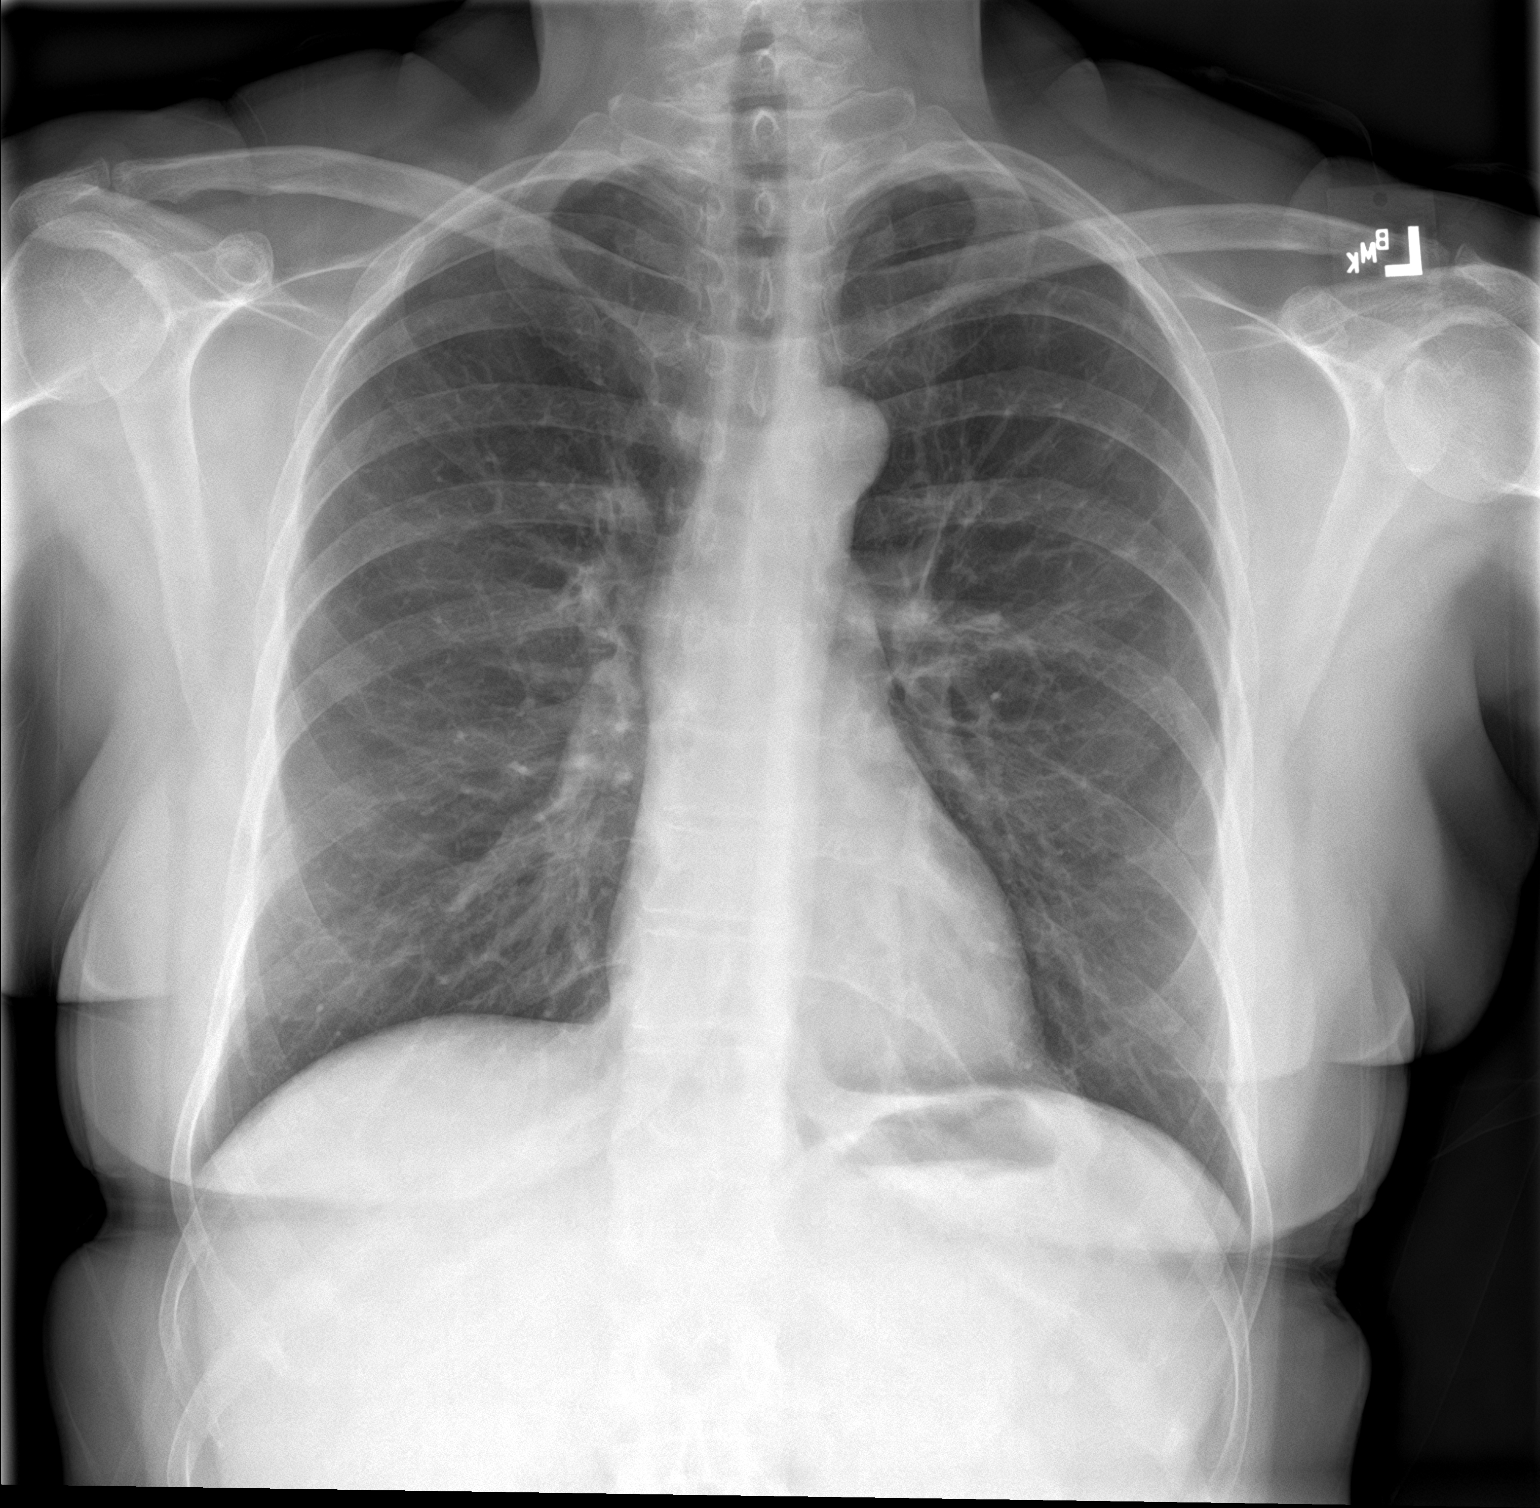

[chest lat]
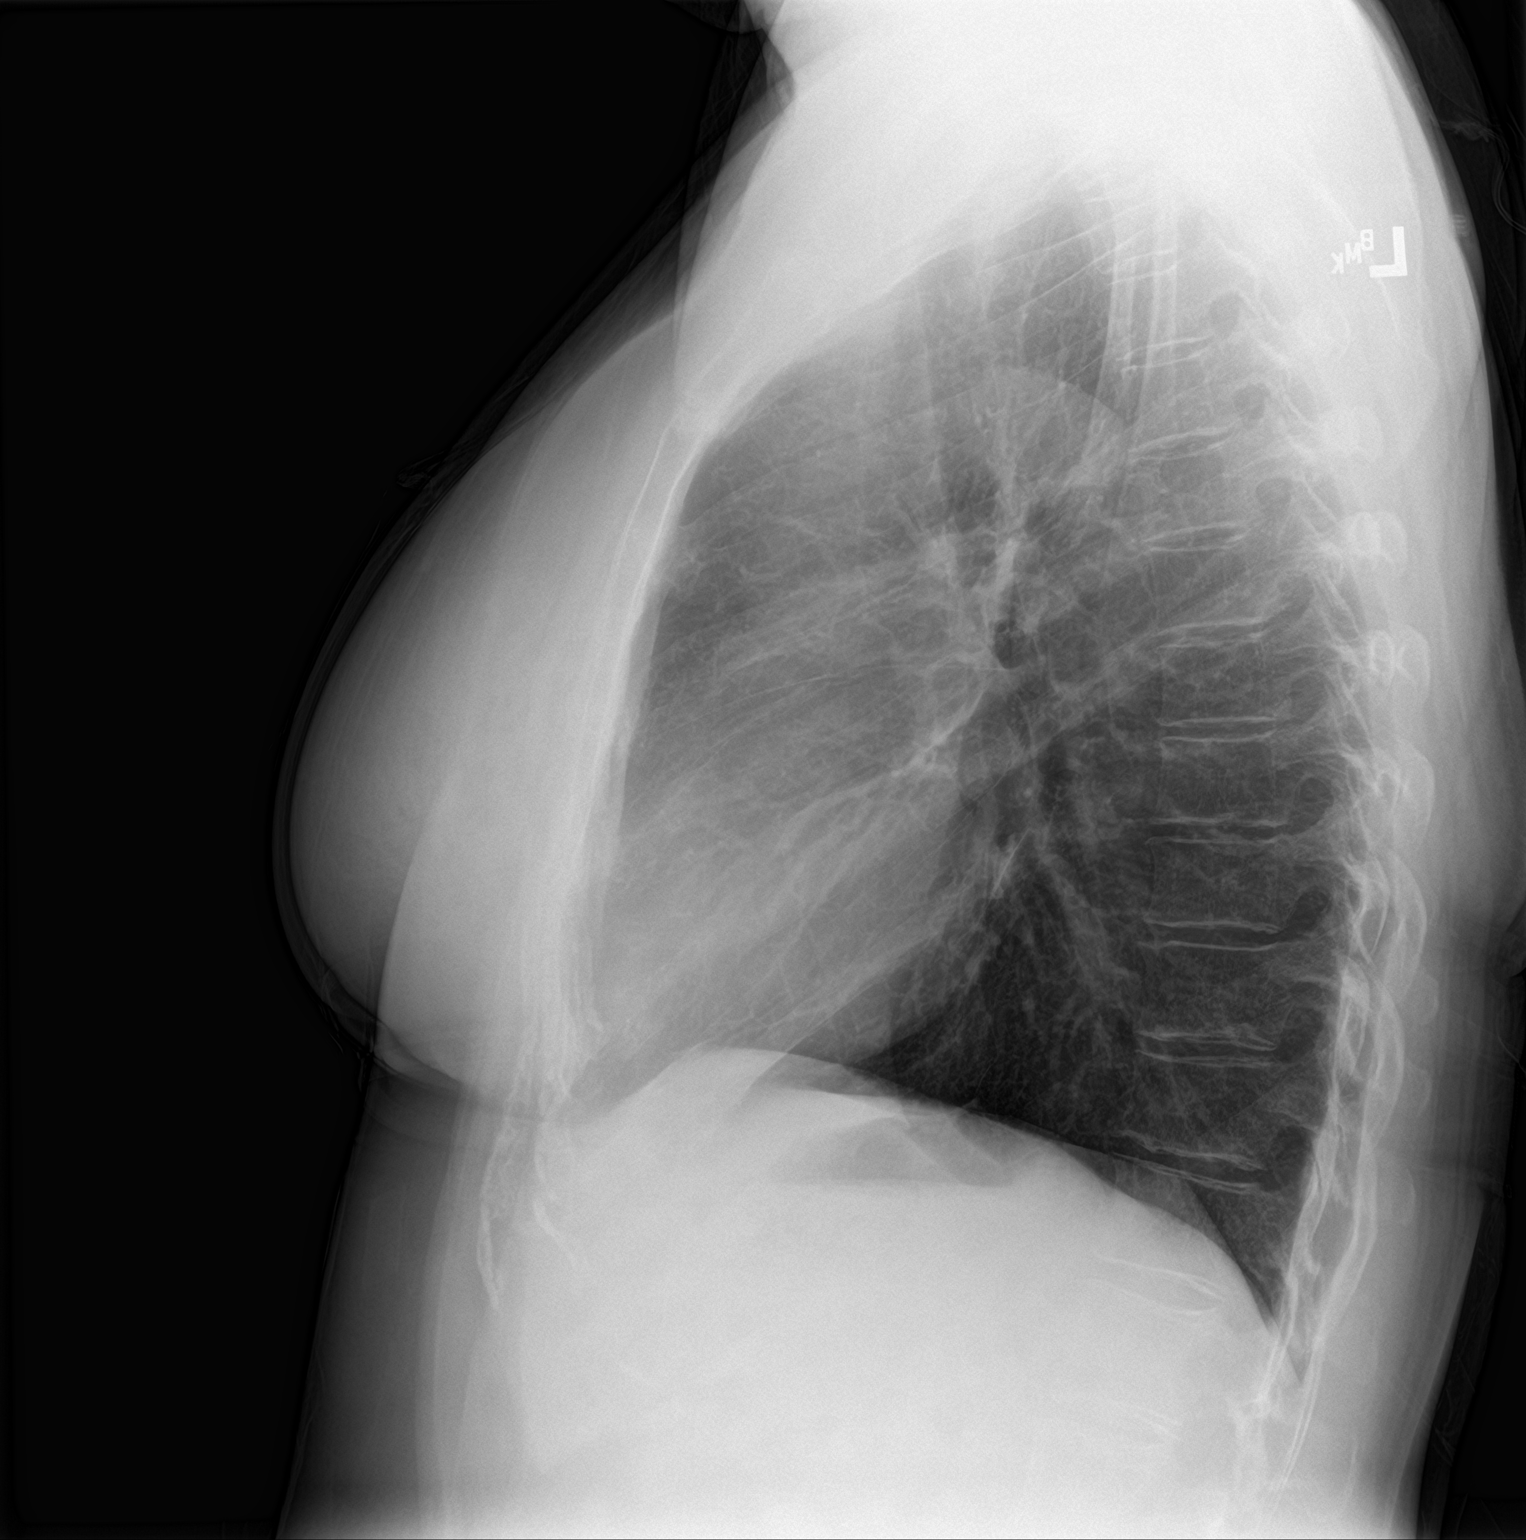

[2 of 2 positions shown; findings below may reference images not displayed]

FINDINGS: The heart size and mediastinal contours are within normal limits.
Both lungs are clear. The visualized skeletal structures are
unremarkable.
IMPRESSION: No active cardiopulmonary disease.

## 2021-06-09 ENCOUNTER — Other Ambulatory Visit: Payer: Self-pay

## 2021-06-09 ENCOUNTER — Ambulatory Visit (INDEPENDENT_AMBULATORY_CARE_PROVIDER_SITE_OTHER): Payer: Self-pay | Admitting: Plastic Surgery

## 2021-06-09 DIAGNOSIS — Z411 Encounter for cosmetic surgery: Secondary | ICD-10-CM

## 2021-06-09 NOTE — Progress Notes (Signed)
Patient presents to discuss Botox treatment.  She has had it in the past but cannot remember exactly how much.  She is headed in the forehead, glabella and crows feet.  On exam she has dynamic and static lines in those areas.  We discussed the risks and benefits of Botox treatment and she is interested in moving forward.  All the areas were prepped with an alcohol pad and 32 units of Botox were distributed throughout the forehead, glabella and crows feet.  All of her questions were answered we will plan to see her again in 2 weeks for touchup if necessary and otherwise at her next visit.

## 2021-06-23 ENCOUNTER — Other Ambulatory Visit: Payer: Self-pay

## 2021-06-23 ENCOUNTER — Ambulatory Visit (INDEPENDENT_AMBULATORY_CARE_PROVIDER_SITE_OTHER): Payer: Self-pay | Admitting: Plastic Surgery

## 2021-06-23 DIAGNOSIS — Z411 Encounter for cosmetic surgery: Secondary | ICD-10-CM

## 2021-06-23 NOTE — Progress Notes (Signed)
Patient presents about 2 weeks postop from Botox injection to the forehead, glabella and crows feet.  She is overall happy but feels that she would want a little bit more particularly in the upper forehead.  On animation there is still some movement just inferior to her hairline and a little bit of lateral pole to her eyebrows.  Forehead was prepped with alcohol pad and 10 units of Botox were distributed throughout these areas which I suspect will correct her concerns.  She also asked about skin resurfacing I do think she would be a reasonable candidate for fracture or halo laser and she took some information is going to think about that.  We will plan to see her at her next visit.

## 2024-05-24 ENCOUNTER — Encounter: Payer: Self-pay | Admitting: Advanced Practice Midwife
